# Patient Record
Sex: Male | Born: 1975 | Race: Black or African American | Hispanic: No | Marital: Married | Smoking: Current every day smoker
Health system: Southern US, Community
[De-identification: ages and names within clinical notes are randomized; demographics above are authoritative.]

---

## 2018-09-29 ENCOUNTER — Emergency Department
Admission: EM | Admit: 2018-09-29 | Discharge: 2018-09-29 | Discharge status: Against Medical Advice | Payer: Self-pay | Attending: Emergency Medicine | Admitting: Emergency Medicine

## 2018-09-29 ENCOUNTER — Other Ambulatory Visit: Payer: Self-pay

## 2018-09-29 ENCOUNTER — Encounter: Payer: Self-pay | Admitting: Emergency Medicine

## 2018-09-29 ENCOUNTER — Emergency Department: Payer: Self-pay

## 2018-09-29 DIAGNOSIS — Y92830 Public park as the place of occurrence of the external cause: Secondary | ICD-10-CM | POA: Insufficient documentation

## 2018-09-29 DIAGNOSIS — S0990XA Unspecified injury of head, initial encounter: Secondary | ICD-10-CM | POA: Insufficient documentation

## 2018-09-29 DIAGNOSIS — Y939 Activity, unspecified: Secondary | ICD-10-CM | POA: Insufficient documentation

## 2018-09-29 DIAGNOSIS — Y999 Unspecified external cause status: Secondary | ICD-10-CM | POA: Insufficient documentation

## 2018-09-29 DIAGNOSIS — S01511A Laceration without foreign body of lip, initial encounter: Secondary | ICD-10-CM | POA: Insufficient documentation

## 2018-09-29 DIAGNOSIS — F17210 Nicotine dependence, cigarettes, uncomplicated: Secondary | ICD-10-CM | POA: Insufficient documentation

## 2018-09-29 MED ORDER — SODIUM CHLORIDE 0.9 % IV BOLUS: 1000.0000 mL | Freq: Once | INTRAVENOUS | Status: DC

## 2018-09-29 MED ORDER — LIDOCAINE HCL (PF) 1 % IJ SOLN
INTRAMUSCULAR | Status: AC
  Administered 2018-09-29: 5 mL
  Filled 2018-09-29: qty 5

## 2018-09-29 MED ORDER — LIDOCAINE HCL 1 % IJ SOLN
5.0000 mL | Freq: Once | INTRAMUSCULAR | Status: AC
  Administered 2018-09-29: 18:00:00 5 mL
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
## 2018-09-29 NOTE — ED Notes (Signed)
This RN asked pt for urine sample, pt refused. Pt told by this RN that EDP wanted to do blood work and urine tests, pt refused. Pt refused all tests and orders ordered by EDP: IV insertion, blood draw, urine tests. EDP notified.

## 2018-10-01 ENCOUNTER — Telehealth: Payer: Self-pay | Admitting: Emergency Medicine

## 2018-10-01 NOTE — Telephone Encounter (Signed)
Called patient to inform of radiology addendum indicating cyst --dermal.  I left message to call me.
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
